# Patient Record
Sex: Male | Born: 1985 | Race: Black or African American | Hispanic: No | Smoking: Never smoker
Health system: Southern US, Community
[De-identification: ages and names within clinical notes are randomized; demographics above are authoritative.]

---

## 2015-04-02 ENCOUNTER — Emergency Department (HOSPITAL_COMMUNITY): Payer: BLUE CROSS/BLUE SHIELD

## 2015-04-02 ENCOUNTER — Emergency Department (HOSPITAL_COMMUNITY)
Admission: EM | Admit: 2015-04-02 | Discharge: 2015-04-02 | Disposition: A | Discharge status: Home / Self Care | Payer: BLUE CROSS/BLUE SHIELD | Attending: Emergency Medicine | Admitting: Emergency Medicine

## 2015-04-02 ENCOUNTER — Encounter (HOSPITAL_COMMUNITY): Payer: Self-pay | Admitting: Emergency Medicine

## 2015-04-02 DIAGNOSIS — R109 Unspecified abdominal pain: Secondary | ICD-10-CM | POA: Diagnosis not present

## 2015-04-02 DIAGNOSIS — M62830 Muscle spasm of back: Secondary | ICD-10-CM | POA: Insufficient documentation

## 2015-04-02 DIAGNOSIS — R0602 Shortness of breath: Secondary | ICD-10-CM | POA: Diagnosis present

## 2015-04-02 LAB — URINALYSIS, ROUTINE W REFLEX MICROSCOPIC
BILIRUBIN URINE: NEGATIVE
GLUCOSE, UA: NEGATIVE mg/dL
HGB URINE DIPSTICK: NEGATIVE
KETONES UR: NEGATIVE mg/dL
LEUKOCYTES UA: NEGATIVE
NITRITE: NEGATIVE
PH: 6.5 (ref 5.0–8.0)
PROTEIN: NEGATIVE mg/dL
Specific Gravity, Urine: 1.02 (ref 1.005–1.030)

## 2015-04-02 LAB — BASIC METABOLIC PANEL
ANION GAP: 9 (ref 5–15)
BUN: 10 mg/dL (ref 6–20)
CALCIUM: 9.7 mg/dL (ref 8.9–10.3)
CO2: 27 mmol/L (ref 22–32)
Chloride: 104 mmol/L (ref 101–111)
Creatinine, Ser: 1.16 mg/dL (ref 0.61–1.24)
GFR calc Af Amer: >60 mL/min (ref >60)
GFR calc non Af Amer: >60 mL/min (ref >60)
GLUCOSE: 90 mg/dL (ref 65–99)
Potassium: 4.1 mmol/L (ref 3.5–5.1)
Sodium: 140 mmol/L (ref 135–145)

## 2015-04-02 LAB — CBC
HEMATOCRIT: 42.2 % (ref 39.0–52.0)
Hemoglobin: 13.3 g/dL (ref 13.0–17.0)
MCH: 26.4 pg (ref 26.0–34.0)
MCHC: 31.5 g/dL (ref 30.0–36.0)
MCV: 83.7 fL (ref 78.0–100.0)
PLATELETS: 241 10*3/uL (ref 150–400)
RBC: 5.04 MIL/uL (ref 4.22–5.81)
RDW: 13.7 % (ref 11.5–15.5)
WBC: 4.4 10*3/uL (ref 4.0–10.5)

## 2015-04-02 LAB — I-STAT TROPONIN, ED: Troponin i, poc: 0 ng/mL (ref 0.00–0.08)

## 2015-04-02 MED ORDER — OXYCODONE-ACETAMINOPHEN 5-325 MG PO TABS
ORAL_TABLET | ORAL | Status: AC
  Filled 2015-04-02: qty 1

## 2015-04-02 MED ORDER — MORPHINE SULFATE 30 MG PO TABS: 15.0000 mg | ORAL_TABLET | ORAL | Status: AC | PRN

## 2015-04-02 MED ORDER — OXYCODONE-ACETAMINOPHEN 5-325 MG PO TABS
1.0000 | ORAL_TABLET | ORAL | Status: DC | PRN
  Administered 2015-04-02: 1 via ORAL

## 2015-04-02 MED ORDER — CYCLOBENZAPRINE HCL 10 MG PO TABS: 10.0000 mg | ORAL_TABLET | Freq: Two times a day (BID) | ORAL | Status: AC | PRN

## 2015-04-02 MED ORDER — NAPROSYN 500 MG PO TABS: 500.0000 mg | ORAL_TABLET | Freq: Two times a day (BID) | ORAL | Status: AC

## 2015-04-02 MED ORDER — BUPIVACAINE-EPINEPHRINE (PF) 0.25% -1:200000 IJ SOLN
20.0000 mL | Freq: Once | INTRAMUSCULAR | Status: AC
  Administered 2015-04-02: 20 mL
  Filled 2015-04-02: qty 30

## 2015-04-02 MED ORDER — LIDOCAINE 5 % EX PTCH: 1.0000 | MEDICATED_PATCH | CUTANEOUS | Status: AC

## 2015-04-02 NOTE — ED Notes (Signed)
Pt requesting something for pain.  Nurse first advised.

## 2015-04-02 NOTE — ED Notes (Signed)
Pt states "Pain in the middle left portion of my back and shortness of breath". Pt is pointing to the L flank area. Pt respirations equal and unlabored. Pt states he cant take a deep breath in. Breath sounds clear. Denies N/V/D, or urinary issues.

## 2015-04-02 NOTE — ED Provider Notes (Signed)
CSN: [id number]     Arrival date & time 04/02/15  1125 History  By signing my name below, I, Soijett Blue, attest that this documentation has been prepared under the direction and in the presence of Arthor CaptainAbigail Alejah Aristizabal, PA-C Electronically Signed: Soijett Blue, ED Scribe. 04/02/2015. 5:22 PM.   Chief Complaint  Patient presents with  . Flank Pain  . Shortness of Breath     The history is provided by the patient. No language interpreter was used.    HPI Comments: Gabriel Rose is a 30 y.o. male who presents to the Emergency Department complaining of left lateral back pain onset last night. Pt reports that he went to reach for a pot when he felt a penetrating pain that he describes as a tightness. He notes that his back pain is worsened with movement, lifting, ambulating, and taking a deep breathe. He states that he is having associated symptoms of SOB. He states that he has tried 2 500 mg tylenol last night and 1 200 mg iburofen for the relief of his symptoms.  He denies cough, fever, chills, and any other symptoms. Pt is an Scientific laboratory technicianR coordinator.   History reviewed. No pertinent past medical history. History reviewed. No pertinent past surgical history. No family history on file. Social History  Substance Use Topics  . Smoking status: Never Smoker   . Smokeless tobacco: None  . Alcohol Use: Yes    Review of Systems  Constitutional: Negative for fever and chills.  Respiratory: Positive for shortness of breath. Negative for cough.   Musculoskeletal: Positive for back pain. Negative for gait problem.  Skin: Negative for color change, rash and wound.    Allergies  Review of patient's allergies indicates no known allergies.  Home Medications   Prior to Admission medications   Not on File   BP 132/81 mmHg  Pulse 78  Temp(Src) 98.4 F (36.9 C) (Oral)  Resp 18  Ht 5\' 8"  (1.727 m)  Wt 220 lb (99.791 kg)  BMI 33.46 kg/m2  SpO2 100% Physical Exam Physical Exam  Constitutional: Pt  appears well-developed and well-nourished. No distress.  Awake, alert, nontoxic appearance  HENT:  Head: Normocephalic and atraumatic.  Mouth/Throat: Oropharynx is clear and moist. No oropharyngeal exudate.  Eyes: Conjunctivae are normal. No scleral icterus.  Neck: Normal range of motion. Neck supple.  Cardiovascular: Normal rate, regular rhythm and intact distal pulses.   Pulmonary/Chest: Effort normal and breath sounds normal. Diminished breath sounds to bilateral lower bases. No respiratory distress. Pt has no wheezes.  Equal chest expansion  Abdominal: Soft. Bowel sounds are normal. Pt exhibits no mass. There is no tenderness. There is no rebound and no guarding.  Musculoskeletal: reproducible pain with palpation of left latissimus. Pain with right lateral flexion of the torso due to stretch in muscle. Unable to take deep breath. No rash. Normal range of motion. Pt exhibits no edema.  Neurological: Pt is alert.  Speech is clear and goal oriented Moves extremities without ataxia  Skin: Skin is warm and dry. Pt is not diaphoretic.  Psychiatric: Pt has a normal mood and affect.  Nursing note and vitals reviewed.   ED Course  Procedures (including critical care time) DIAGNOSTIC STUDIES: Oxygen Saturation is 100% on RA, nl by my interpretation.    COORDINATION OF CARE: 5:22 PM Discussed treatment plan with pt at bedside which includes UA, labs, CXR, and pt agreed to plan.    Labs Review Labs Reviewed  URINALYSIS, ROUTINE W REFLEX MICROSCOPIC (NOT AT  [facility])  BASIC METABOLIC PANEL  CBC  I-STAT TROPOININ, ED    Imaging Review Dg Chest 2 View  04/02/2015  CLINICAL DATA:  Shortness of breath. EXAM: CHEST  2 VIEW COMPARISON:  None. FINDINGS: Low volume chest with somewhat streaky basilar opacities, greatest behind the heart. No convincing air bronchograms or typical subpleural infarct type opacity. No effusion or air leak. Normal heart size and mediastinal contours. No contributory  osseous finding. IMPRESSION: Low volume chest with bibasilar atelectasis or pneumonia. Electronically Signed   By: Marnee Spring M.D.   On: 04/02/2015 12:33   I have personally reviewed and evaluated these images as part of my medical decision-making.   EKG Interpretation None       Trigger Point Injection Date/Time:04/02/2015 6:21 PM Performed by: Arthor Captain Authorized by: Arthor Captain Consent: Verbal consent obtained. Risks and benefits: risks, benefits and alternatives were discussed Consent given by: patient Patient identity confirmed: provided patient demograhics Preparation: Patient was prepped and draped in the usual sterile fashion. Local anesthesia used: yes Anesthesia: local infiltration Local anesthetic: 0.25% bupivicaine w/epi  Anesthetic total: 15 Patient tolerance: Patient tolerated the procedure well with no immediate complications   MDM   Final diagnoses:  None    Patient with back pain.  No neurological deficits and normal neuro exam.  Patient is ambulatory.  No loss of bowel or bladder control.  No concern for cauda equina.  No fever, night sweats, weight loss, h/o cancer, IVDA, no recent procedure to back. No urinary symptoms suggestive of UTI. Pt is PERC negative and his EKG is not concerning at this time. Supportive care and return precaution discussed. Appears safe for discharge at this time. Follow up as indicated in discharge paperwork.    I personally performed the services described in this documentation, which was scribed in my presence. The recorded information has been reviewed and is accurate.     Arthor Captain, PA-C 04/02/15 1822  Mancel Bale, MD 04/03/15 [phone]

## 2015-04-02 NOTE — Discharge Instructions (Signed)
Back Injury Prevention °Back injuries can be very painful. They can also be difficult to heal. After having one back injury, you are more likely to injure your back again. It is important to learn how to avoid injuring or re-injuring your back. The following tips can help you to prevent a back injury. °WHAT SHOULD I KNOW ABOUT PHYSICAL FITNESS? °· Exercise for 30 minutes per day on most days of the week or as directed by your health care provider. Make sure to: °· Do aerobic exercises, such as walking, jogging, biking, or swimming. °· Do exercises that increase balance and strength, such as tai chi and yoga. These can decrease your risk of falling and injuring your back. °· Do stretching exercises to help with flexibility. °· Try to develop strong abdominal muscles. Your abdominal muscles provide a lot of the support that is needed by your back. °· Maintain a healthy weight.  This helps to decrease your risk of a back injury. °WHAT SHOULD I KNOW ABOUT MY DIET? °· Talk with your health care provider about your overall diet. Take supplements and vitamins only as directed by your health care provider. °· Talk with your health care provider about how much calcium and vitamin D you need each day. These nutrients help to prevent weakening of the bones (osteoporosis). Osteoporosis can cause broken (fractured) bones, which lead to back pain. °· Include good sources of calcium in your diet, such as dairy products, green leafy vegetables, and products that have had calcium added to them (fortified). °· Include good sources of vitamin D in your diet, such as milk and foods that are fortified with vitamin D. °WHAT SHOULD I KNOW ABOUT MY POSTURE? °· Sit up straight and stand up straight. Avoid leaning forward when you sit or hunching over when you stand. °· Choose chairs that have good low-back (lumbar) support. °· If you work at a desk, sit close to it so you do not need to lean over. Keep your chin tucked in. Keep your neck  drawn back, and keep your elbows bent at a right angle. Your arms should look like the letter "L." °· Sit high and close to the steering wheel when you drive. Add a lumbar support to your car seat, if needed. °· Avoid sitting or standing in one position for very long. Take breaks to get up, stretch, and walk around at least one time every hour. Take breaks every hour if you are driving for long periods of time. °· Sleep on your side with your knees slightly bent, or sleep on your back with a pillow under your knees. Do not lie on the front of your body to sleep. °WHAT SHOULD I KNOW ABOUT LIFTING, TWISTING, AND REACHING? °Lifting and Heavy Lifting °· Avoid heavy lifting, especially repetitive heavy lifting. If you must do heavy lifting: °· Stretch before lifting. °· Work slowly. °· Rest between lifts. °· Use a tool such as a cart or a dolly to move objects if one is available. °· Make several small trips instead of carrying one heavy load. °· Ask for help when you need it, especially when moving big objects. °· Follow these steps when lifting: °· Stand with your feet shoulder-width apart. °· Get as close to the object as you can. Do not try to pick up a heavy object that is far from your body. °· Use handles or lifting straps if they are available. °· Bend at your knees. Squat down, but keep your heels off the floor. °·   Keep your shoulders pulled back, your chin tucked in, and your back straight.  Lift the object slowly while you tighten the muscles in your legs, abdomen, and buttocks. Keep the object as close to the center of your body as possible.  Follow these steps when putting down a heavy load:  Stand with your feet shoulder-width apart.  Lower the object slowly while you tighten the muscles in your legs, abdomen, and buttocks. Keep the object as close to the center of your body as possible.  Keep your shoulders pulled back, your chin tucked in, and your back straight.  Bend at your knees. Squat  down, but keep your heels off the floor.  Use handles or lifting straps if they are available. Twisting and Reaching  Avoid lifting heavy objects above your waist.  Do not twist at your waist while you are lifting or carrying a load. If you need to turn, move your feet.  Do not bend over without bending at your knees.  Avoid reaching over your head, across a table, or for an object on a high surface. WHAT ARE SOME OTHER TIPS?  Avoid wet floors and icy ground. Keep sidewalks clear of ice to prevent falls.  Do not sleep on a mattress that is too soft or too hard.  Keep items that are used frequently within easy reach.  Put heavier objects on shelves at waist level, and put lighter objects on lower or higher shelves.  Find ways to decrease your stress, such as exercise, massage, or relaxation techniques. Stress can build up in your muscles. Tense muscles are more vulnerable to injury.  Talk with your health care provider if you feel anxious or depressed. These conditions can make back pain worse.  Wear flat heel shoes with cushioned soles.  Avoid sudden movements.  Use both shoulder straps when carrying a backpack.  Do not use any tobacco products, including cigarettes, chewing tobacco, or electronic cigarettes. If you need help quitting, ask your health care provider.   This information is not intended to replace advice given to you by your health care provider. Make sure you discuss any questions you have with your health care provider.   Document Released: 01/30/2004 Document Revised: 05/08/2014 Document Reviewed: 12/26/2013 Elsevier Interactive Patient Education 2016 [place] therapy can help ease sore, stiff, injured, and tight muscles and joints. Heat relaxes your muscles, which may help ease your pain.  RISKS AND COMPLICATIONS If you have any of the following conditions, do not use heat therapy unless your health care provider has  approved:  Poor circulation.  Healing wounds or scarred skin in the area being treated.  Diabetes, heart disease, or high blood pressure.  Not being able to feel (numbness) the area being treated.  Unusual swelling of the area being treated.  Active infections.  Blood clots.  Cancer.  Inability to communicate pain. This may include young children and people who have problems with their brain function (dementia).  Pregnancy. Heat therapy should only be used on old, pre-existing, or long-lasting (chronic) injuries. Do not use heat therapy on new injuries unless directed by your health care provider. HOW TO USE HEAT THERAPY There are several different kinds of heat therapy, including:  Moist heat pack.  Warm water bath.  Hot water bottle.  Electric heating pad.  Heated gel pack.  Heated wrap.  Electric heating pad. Use the heat therapy method suggested by your health care provider. Follow your health care provider's instructions  on when and how to use heat therapy. GENERAL HEAT THERAPY RECOMMENDATIONS  Do not sleep while using heat therapy. Only use heat therapy while you are awake.  Your skin may turn pink while using heat therapy. Do not use heat therapy if your skin turns red.  Do not use heat therapy if you have new pain.  High heat or long exposure to heat can cause burns. Be careful when using heat therapy to avoid burning your skin.  Do not use heat therapy on areas of your skin that are already irritated, such as with a rash or sunburn. SEEK MEDICAL CARE IF:  You have blisters, redness, swelling, or numbness.  You have new pain.  Your pain is worse. MAKE SURE YOU:  Understand these instructions.  Will watch your condition.  Will get help right away if you are not doing well or get worse.   This information is not intended to replace advice given to you by your health care provider. Make sure you discuss any questions you have with your health care  provider.   Document Released: 03/16/2011 Document Revised: 01/12/2014 Document Reviewed: 02/14/2013 Elsevier Interactive Patient Education 2016 [employer].  Trigger Point Injection Trigger points are areas where you have muscle pain. A trigger point injection is a shot given in the trigger point to relieve that pain. A trigger point might feel like a knot in your muscle. It hurts to press on a trigger point. Sometimes the pain spreads out (radiates) to other parts of the body. For example, pressing on a trigger point in your shoulder might cause pain in your arm or neck. You might have one trigger point. Or, you might have more than one. People often have trigger points in their upper back and lower back. They also occur often in the neck and shoulders. Pain from a trigger point lasts for a long time. It can make it hard to keep moving. You might not be able to do the exercise or physical therapy that could help you deal with the pain. A trigger point injection may help. It does not work for everyone. But, it may relieve your pain for a few days or a few months. A trigger point injection does not cure long-lasting (chronic) pain. LET YOUR CAREGIVER KNOW ABOUT:  Any allergies (especially to latex, lidocaine, or steroids).  Blood-thinning medicines that you take. These drugs can lead to bleeding or bruising after an injection. They include:  Aspirin.  Ibuprofen.  Clopidogrel.  Warfarin.  Other medicines you take. This includes all vitamins, herbs, eyedrops, over-the-counter medicines, and creams.  Use of steroids.  Recent infections.  Past problems with numbing medicines.  Bleeding problems.  Surgeries you have had.  Other health problems. RISKS AND COMPLICATIONS A trigger point injection is a safe treatment. However, problems may develop, such as:  Minor side effects usually go away in 1 to 2 days. These may include:  Soreness.  Bruising.  Stiffness.  More serious  problems are rare. But, they may include:  Bleeding under the skin (hematoma).  Skin infection.  Breaking off of the needle under your skin.  Lung puncture.  The trigger point injection may not work for you. BEFORE THE PROCEDURE You may need to stop taking any medicine that thins your blood. This is to prevent bleeding and bruising. Usually these medicines are stopped several days before the injection. No other preparation is needed. PROCEDURE  A trigger point injection can be given in your caregiver's office or  in a clinic. Each injection takes 2 minutes or less.  Your caregiver will feel for trigger points. The caregiver may use a marker to circle the area for the injection.  The skin over the trigger point will be washed with a germ-killing (antiseptic) solution.  The caregiver pinches the spot for the injection.  Then, a very thin needle is used for the shot. You may feel pain or a twitching feeling when the needle enters the trigger point.  A numbing solution may be injected into the trigger point. Sometimes a drug to keep down swelling, redness, and warmth (inflammation) is also injected.  Your caregiver moves the needle around the trigger zone until the tightness and twitching goes away.  After the injection, your caregiver may put gentle pressure over the injection site.  Then it is covered with a bandage. AFTER THE PROCEDURE  You can go right home after the injection.  The bandage can be taken off after a few hours.  You may feel sore and stiff for 1 to 2 days.  Go back to your regular activities slowly. Your caregiver may ask you to stretch your muscles. Do not do anything that takes extra energy for a few days.  Follow your caregiver's instructions to manage and treat other pain.   This information is not intended to replace advice given to you by your health care provider. Make sure you discuss any questions you have with your health care provider.   Document  Released: 12/11/2010 Document Revised: 04/18/2012 Document Reviewed: 12/11/2010 Elsevier Interactive Patient Education 2016 [place] of Breath Shortness of breath means you have trouble breathing. It could also mean that you have a medical problem. You should get immediate medical care for shortness of breath. CAUSES   Not enough oxygen in the air such as with high altitudes or a smoke-filled room.  Certain lung diseases, infections, or problems.  Heart disease or conditions, such as angina or heart failure.  Low red blood cells (anemia).  Poor physical fitness, which can cause shortness of breath when you exercise.  Chest or back injuries or stiffness.  Being overweight.  Smoking.  Anxiety, which can make you feel like you are not getting enough air. DIAGNOSIS  Serious medical problems can often be found during your physical exam. Tests may also be done to determine why you are having shortness of breath. Tests may include:  Chest X-rays.  Lung function tests.  Blood tests.  An electrocardiogram (ECG).  An ambulatory electrocardiogram. An ambulatory ECG records your heartbeat patterns over a 24-hour period.  Exercise testing.  A transthoracic echocardiogram (TTE). During echocardiography, sound waves are used to evaluate how blood flows through your heart.  A transesophageal echocardiogram (TEE).  Imaging scans. Your health care provider may not be able to find a cause for your shortness of breath after your exam. In this case, it is important to have a follow-up exam with your health care provider as directed.  TREATMENT  Treatment for shortness of breath depends on the cause of your symptoms and can vary greatly. HOME CARE INSTRUCTIONS   Do not smoke. Smoking is a common cause of shortness of breath. If you smoke, ask for help to quit.  Avoid being around chemicals or things that may bother your breathing, such as paint fumes and dust.  Rest as  needed. Slowly resume your usual activities.  If medicines were prescribed, take them as directed for the full length of time directed. This includes oxygen  and any inhaled medicines.  Keep all follow-up appointments as directed by your health care provider. SEEK MEDICAL CARE IF:   Your condition does not improve in the time expected.  You have a hard time doing your normal activities even with rest.  You have any new symptoms. SEEK IMMEDIATE MEDICAL CARE IF:   Your shortness of breath gets worse.  You feel light-headed, faint, or develop a cough not controlled with medicines.  You start coughing up blood.  You have pain with breathing.  You have chest pain or pain in your arms, shoulders, or abdomen.  You have a fever.  You are unable to walk up stairs or exercise the way you normally do. MAKE SURE YOU:  Understand these instructions.  Will watch your condition.  Will get help right away if you are not doing well or get worse.   This information is not intended to replace advice given to you by your health care provider. Make sure you discuss any questions you have with your health care provider.   Document Released: 09/16/2000 Document Revised: 12/27/2012 Document Reviewed: 03/09/2011 Elsevier Interactive Patient Education [employer].

## 2017-03-06 IMAGING — DX DG CHEST 2V
2 series · 2 of 2 positions shown · non-contrast
Comparison: None.

CLINICAL DATA: Shortness of breath.

EXAM:
CHEST  2 VIEW

[w chest pa]
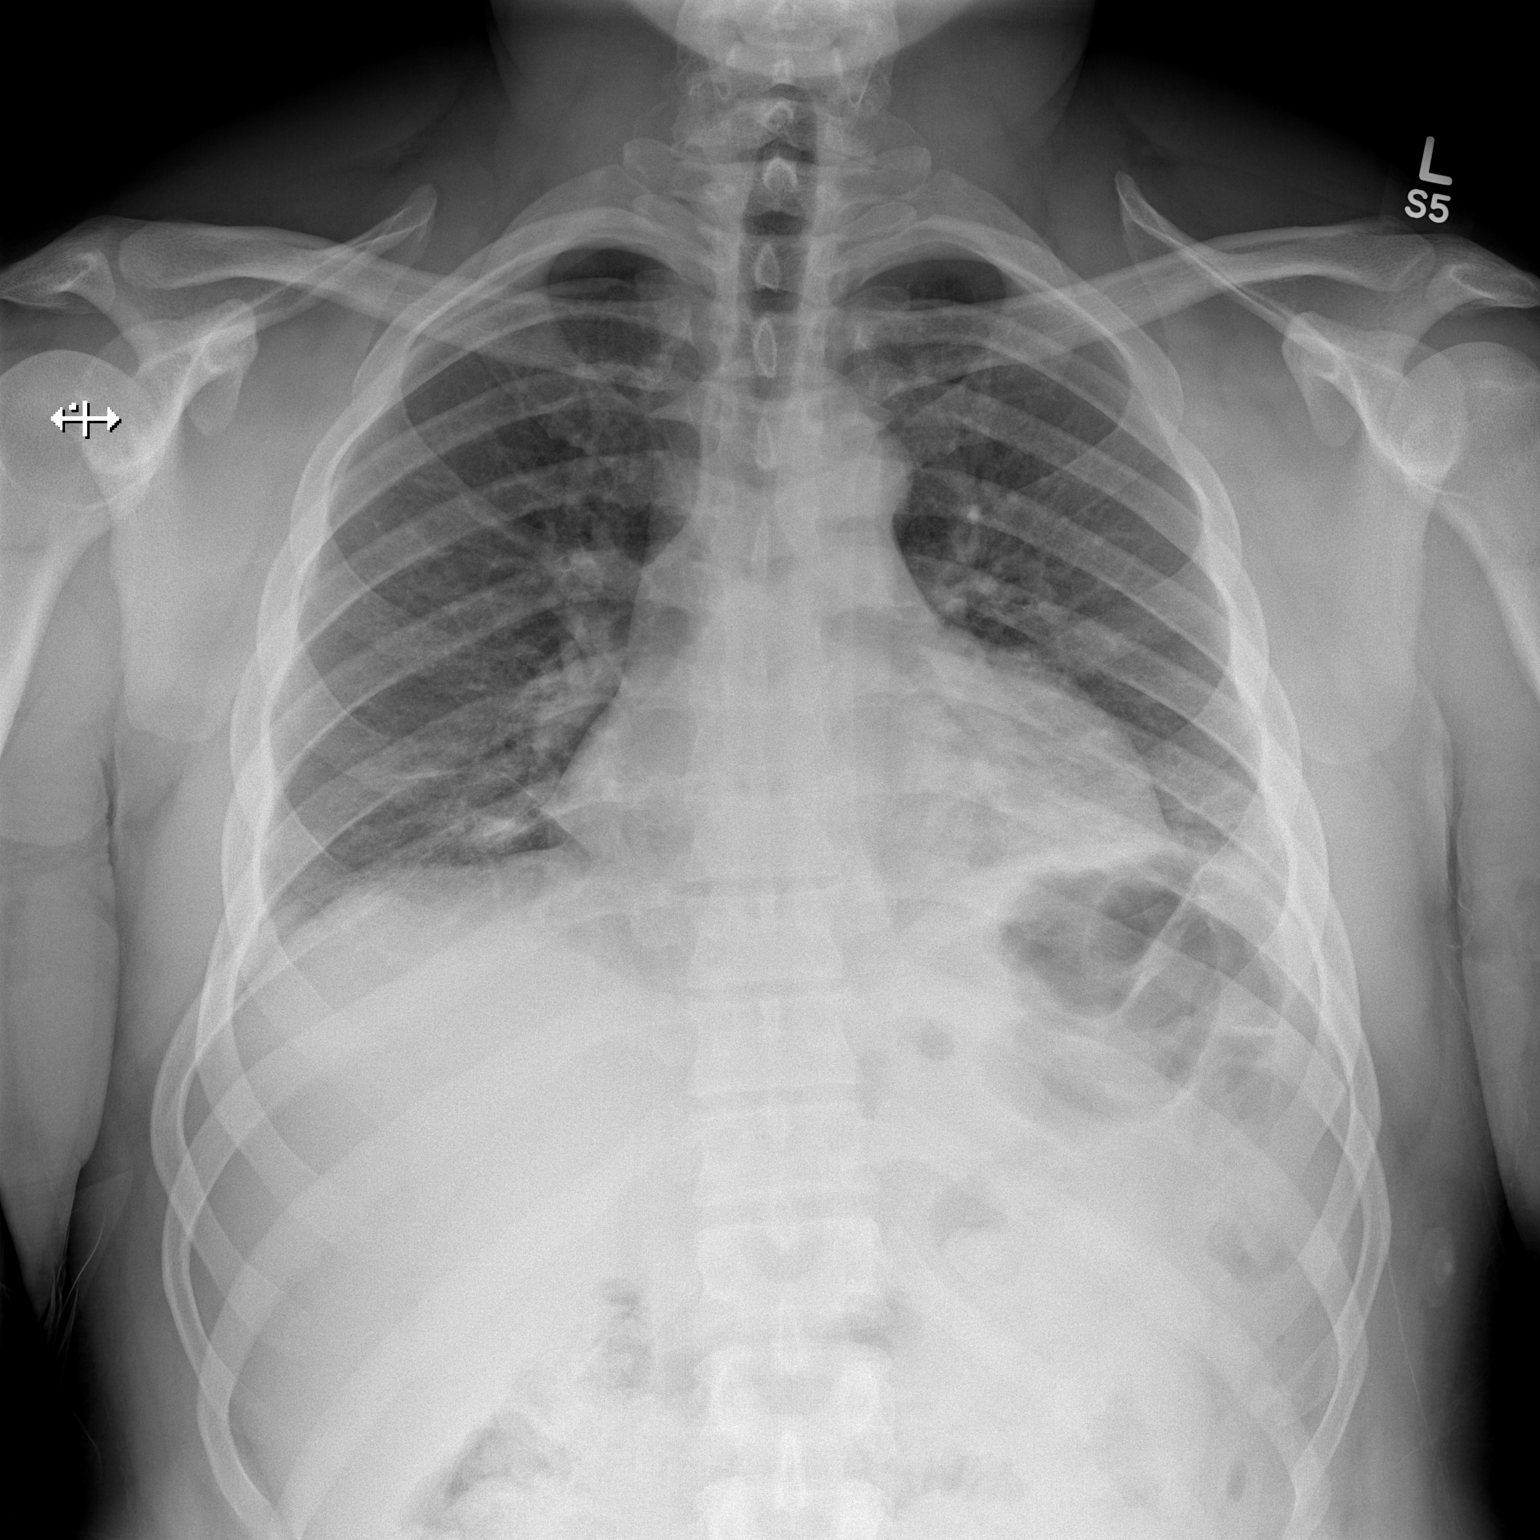

[w chest lat]
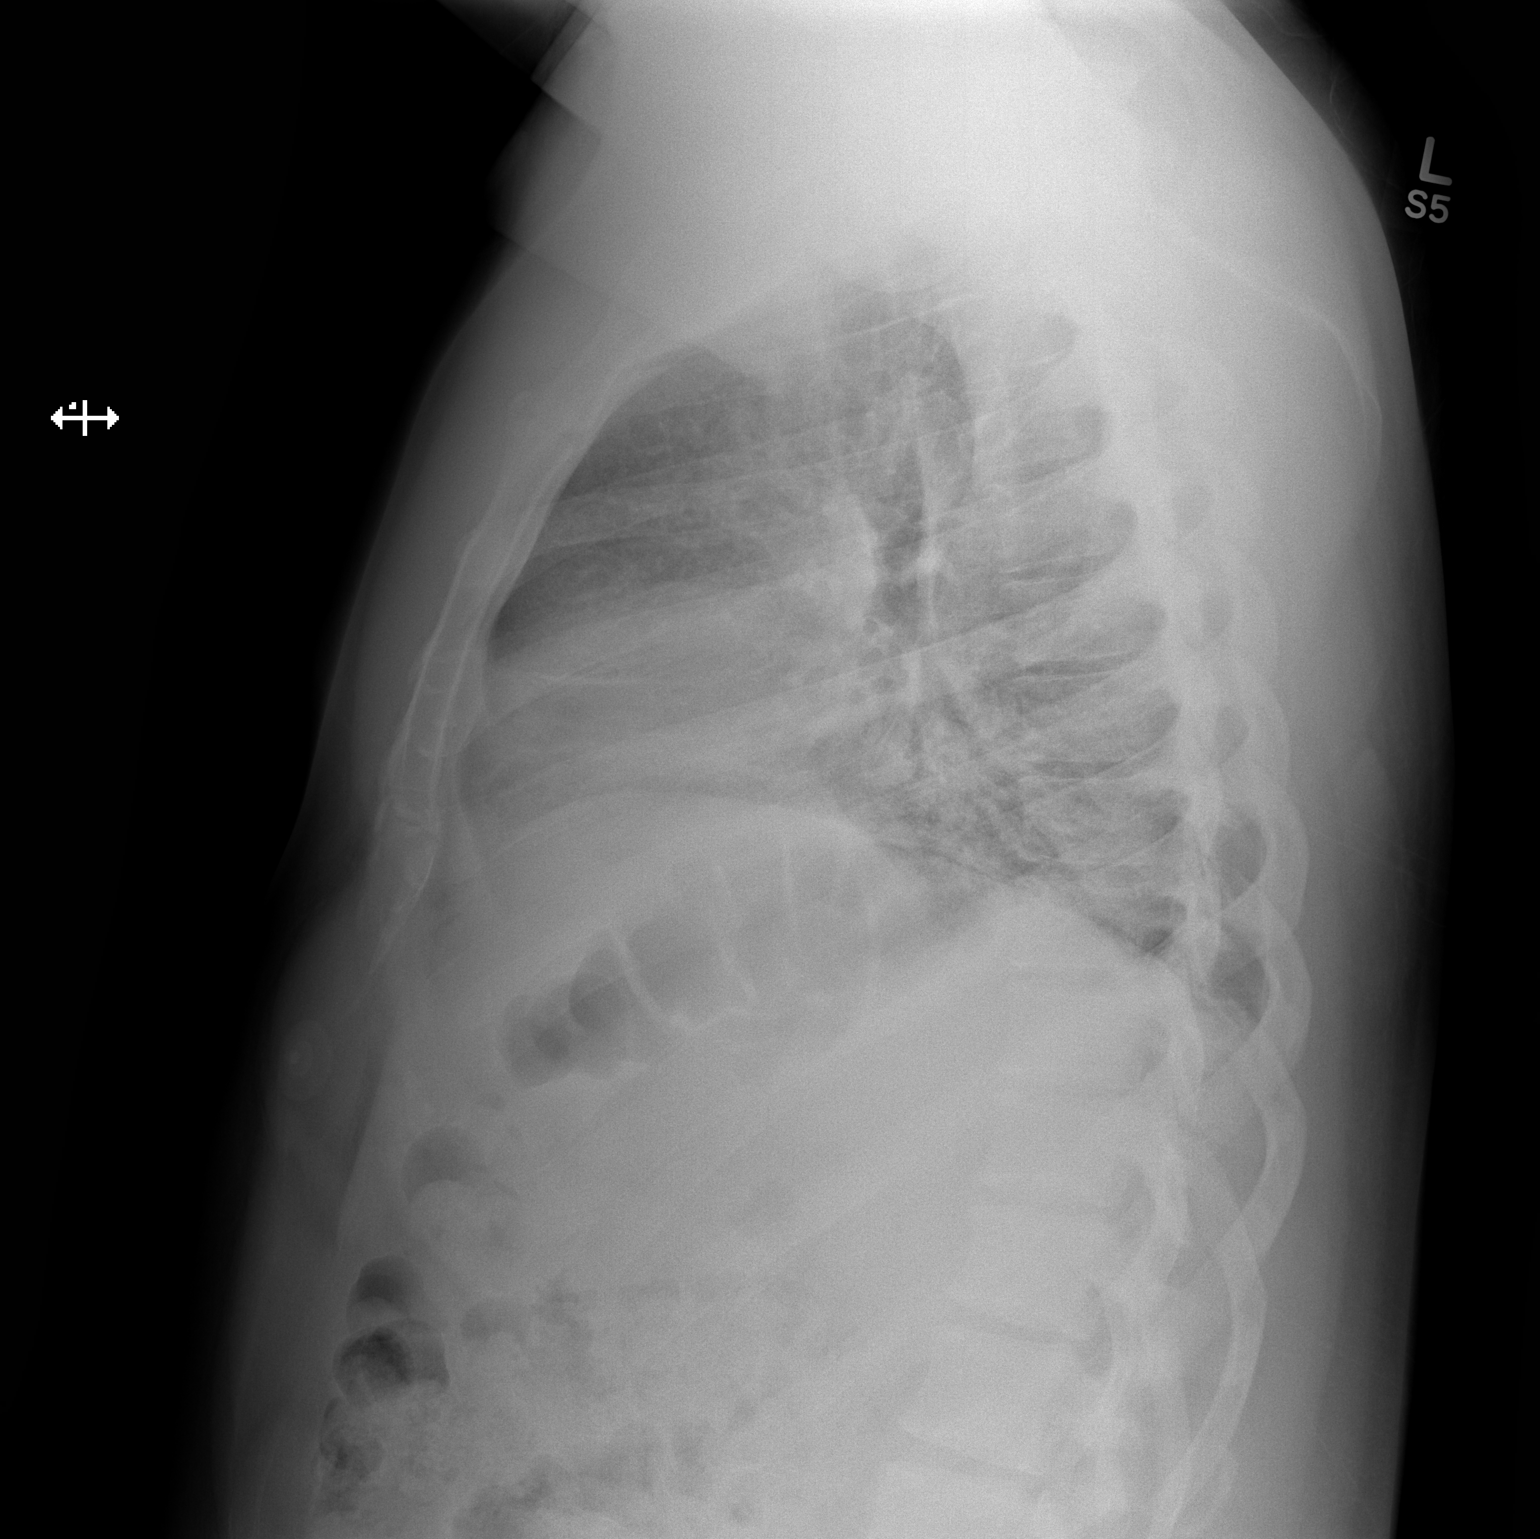

[2 of 2 positions shown; findings below may reference images not displayed]

FINDINGS: Low volume chest with somewhat streaky basilar opacities, greatest
behind the heart. No convincing air bronchograms or typical
subpleural infarct type opacity. No effusion or air leak. Normal
heart size and mediastinal contours. No contributory osseous
finding.
IMPRESSION: Low volume chest with bibasilar atelectasis or pneumonia.
